# Patient Record
Sex: Female | Born: 1995 | Race: Black or African American | Hispanic: No | Marital: Single
Health system: Southern US, Community
[De-identification: ages and names within clinical notes are randomized; demographics above are authoritative.]

---
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
## 2017-01-06 ENCOUNTER — Other Ambulatory Visit: Payer: Self-pay | Admitting: Family Medicine

## 2017-01-06 ENCOUNTER — Other Ambulatory Visit (HOSPITAL_COMMUNITY)
Admission: RE | Admit: 2017-01-06 | Discharge: 2017-01-06 | Disposition: A | Discharge status: Home / Self Care | Payer: BLUE CROSS/BLUE SHIELD | Source: Ambulatory Visit | Attending: Family Medicine | Admitting: Family Medicine

## 2017-01-06 DIAGNOSIS — N63 Unspecified lump in unspecified breast: Secondary | ICD-10-CM

## 2017-01-06 DIAGNOSIS — Z113 Encounter for screening for infections with a predominantly sexual mode of transmission: Secondary | ICD-10-CM | POA: Insufficient documentation

## 2017-01-10 ENCOUNTER — Other Ambulatory Visit: Payer: BLUE CROSS/BLUE SHIELD

## 2017-01-10 LAB — URINE CYTOLOGY ANCILLARY ONLY
Bacterial vaginitis: POSITIVE — AB
CANDIDA VAGINITIS: NEGATIVE
Chlamydia: NEGATIVE
Neisseria Gonorrhea: NEGATIVE
Trichomonas: NEGATIVE

## 2017-01-20 ENCOUNTER — Other Ambulatory Visit: Payer: BLUE CROSS/BLUE SHIELD

## 2017-10-24 ENCOUNTER — Ambulatory Visit: Payer: Self-pay | Admitting: Allergy & Immunology

## 2018-10-31 ENCOUNTER — Other Ambulatory Visit: Payer: Self-pay | Admitting: Family Medicine

## 2018-10-31 ENCOUNTER — Other Ambulatory Visit (HOSPITAL_COMMUNITY)
Admission: RE | Admit: 2018-10-31 | Discharge: 2018-10-31 | Disposition: A | Discharge status: Home / Self Care | Payer: BC Managed Care – PPO | Source: Ambulatory Visit | Attending: Family Medicine | Admitting: Family Medicine

## 2018-10-31 DIAGNOSIS — Z01419 Encounter for gynecological examination (general) (routine) without abnormal findings: Secondary | ICD-10-CM | POA: Insufficient documentation

## 2018-11-02 LAB — CYTOLOGY - PAP: Diagnosis: NEGATIVE

## 2018-11-03 LAB — URINE CYTOLOGY ANCILLARY ONLY
Bacterial vaginitis: POSITIVE — AB
Candida vaginitis: NEGATIVE
Chlamydia: NEGATIVE
Neisseria Gonorrhea: NEGATIVE
Trichomonas: NEGATIVE

## 2020-03-18 ENCOUNTER — Emergency Department: Payer: BC Managed Care – PPO

## 2020-03-18 ENCOUNTER — Other Ambulatory Visit: Payer: Self-pay

## 2020-03-18 ENCOUNTER — Emergency Department
Admission: EM | Admit: 2020-03-18 | Discharge: 2020-03-18 | Disposition: A | Discharge status: Home / Self Care | Payer: BC Managed Care – PPO | Attending: Student in an Organized Health Care Education/Training Program | Admitting: Student in an Organized Health Care Education/Training Program

## 2020-03-18 DIAGNOSIS — M545 Low back pain, unspecified: Secondary | ICD-10-CM | POA: Diagnosis present

## 2020-03-18 DIAGNOSIS — M546 Pain in thoracic spine: Secondary | ICD-10-CM | POA: Insufficient documentation

## 2020-03-18 LAB — POC URINE PREG, ED: Preg Test, Ur: NEGATIVE

## 2020-03-18 MED ORDER — MELOXICAM 7.5 MG PO TABS
15.0000 mg | ORAL_TABLET | Freq: Once | ORAL | Status: AC
  Administered 2020-03-18: 15 mg via ORAL
  Filled 2020-03-18: qty 2

## 2020-03-18 MED ORDER — MELOXICAM 15 MG PO TABS
15.0000 mg | ORAL_TABLET | Freq: Every day | ORAL | 0 refills | Status: AC
Start: 2020-03-18 — End: 2020-04-17

## 2020-03-18 MED ORDER — METHOCARBAMOL 750 MG PO TABS: 750.0000 mg | ORAL_TABLET | Freq: Four times a day (QID) | ORAL | 0 refills | Status: AC | PRN

## 2020-03-18 MED ORDER — TRAMADOL HCL 50 MG PO TABS: 50.0000 mg | ORAL_TABLET | Freq: Four times a day (QID) | ORAL | 0 refills | Status: AC | PRN
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
## 2021-04-10 ENCOUNTER — Other Ambulatory Visit: Payer: Self-pay | Admitting: Family Medicine

## 2021-04-10 ENCOUNTER — Other Ambulatory Visit (HOSPITAL_COMMUNITY)
Admission: RE | Admit: 2021-04-10 | Discharge: 2021-04-10 | Disposition: A | Discharge status: Home / Self Care | Payer: BC Managed Care – PPO | Source: Ambulatory Visit | Attending: Family Medicine | Admitting: Family Medicine

## 2021-04-10 DIAGNOSIS — Z113 Encounter for screening for infections with a predominantly sexual mode of transmission: Secondary | ICD-10-CM | POA: Diagnosis present

## 2021-04-10 DIAGNOSIS — N898 Other specified noninflammatory disorders of vagina: Secondary | ICD-10-CM | POA: Insufficient documentation

## 2021-04-13 LAB — MOLECULAR ANCILLARY ONLY
Bacterial Vaginitis (gardnerella): NEGATIVE
Bacterial Vaginitis (gardnerella): POSITIVE — AB
Candida Glabrata: NEGATIVE
Candida Glabrata: NEGATIVE
Candida Vaginitis: NEGATIVE
Candida Vaginitis: POSITIVE — AB
Chlamydia: NEGATIVE
Chlamydia: NEGATIVE
Comment: NEGATIVE
Comment: NEGATIVE
Comment: NEGATIVE
Comment: NEGATIVE
Comment: NEGATIVE
Comment: NEGATIVE
Comment: NEGATIVE
Comment: NEGATIVE
Comment: NEGATIVE
Comment: NEGATIVE
Comment: NORMAL
Comment: NORMAL
Neisseria Gonorrhea: NEGATIVE
Neisseria Gonorrhea: NEGATIVE
Trichomonas: NEGATIVE
Trichomonas: NEGATIVE
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
## 2023-05-09 ENCOUNTER — Emergency Department (HOSPITAL_BASED_OUTPATIENT_CLINIC_OR_DEPARTMENT_OTHER)
Admission: EM | Admit: 2023-05-09 | Discharge: 2023-05-09 | Disposition: A | Discharge status: Home / Self Care | Payer: BC Managed Care – PPO | Attending: Emergency Medicine | Admitting: Emergency Medicine

## 2023-05-09 ENCOUNTER — Other Ambulatory Visit: Payer: Self-pay

## 2023-05-09 ENCOUNTER — Emergency Department (HOSPITAL_BASED_OUTPATIENT_CLINIC_OR_DEPARTMENT_OTHER): Payer: BC Managed Care – PPO

## 2023-05-09 DIAGNOSIS — R911 Solitary pulmonary nodule: Secondary | ICD-10-CM | POA: Diagnosis not present

## 2023-05-09 DIAGNOSIS — R079 Chest pain, unspecified: Secondary | ICD-10-CM | POA: Diagnosis present

## 2023-05-09 LAB — HCG, SERUM, QUALITATIVE: Preg, Serum: NEGATIVE

## 2023-05-09 LAB — BASIC METABOLIC PANEL
Anion gap: 7 (ref 5–15)
BUN: 10 mg/dL (ref 6–20)
CO2: 25 mmol/L (ref 22–32)
Calcium: 9.3 mg/dL (ref 8.9–10.3)
Chloride: 106 mmol/L (ref 98–111)
Creatinine, Ser: 0.9 mg/dL (ref 0.44–1.00)
GFR, Estimated: >60 mL/min (ref >60)
Glucose, Bld: 113 mg/dL — ABNORMAL HIGH (ref 70–99)
Potassium: 3.9 mmol/L (ref 3.5–5.1)
Sodium: 138 mmol/L (ref 135–145)

## 2023-05-09 MED ORDER — IOHEXOL 350 MG/ML SOLN
100.0000 mL | Freq: Once | INTRAVENOUS | Status: AC | PRN
  Administered 2023-05-09: 75 mL via INTRAVENOUS

## 2023-05-09 NOTE — ED Triage Notes (Signed)
Seen at [facility] for left sided CP- 1 week. Nexplanon palced 10/30. Worked up for CP and d-dimer tested. D-dimer found to be elevated- 3.8 mg/L FEU

## 2023-05-09 NOTE — Discharge Instructions (Addendum)
Please follow-up with your primary care provider regards recent ER visit.  Today your labs and imaging were reassuring however you do have a 3 mm nodule in your right lower lobe that does not need further follow-up.  If symptoms change or worsen please return to the ER.

## 2023-05-09 NOTE — ED Provider Notes (Signed)
[place] EMERGENCY DEPARTMENT AT [facility] Provider Note   CSN: [id number] Arrival date & time: 05/09/23  1637     History  Chief Complaint  Patient presents with   Chest Pain    Taylor Brandt is a 27 y.o. female no pertinent past medical history presented due to an elevated D-dimer.  Patient states that she has had chest discomfort since last Friday with some shortness of breath however this is mostly in the morning states that it is exacerbated with movement of her left shoulder.  Patient denies any cardiac or respiratory history or history of blood clots.  Patient is not on estrogen but notes that last 3 months she did go on a road trip to [place].  Patient denies hemoptysis or leg swelling or calf tenderness.  Patient went to urgent care and was referred here due to elevated D-dimer.    Home Medications Prior to Admission medications   Not on File      Allergies    Patient has no allergy information on record.    Review of Systems   Review of Systems  Cardiovascular:  Positive for chest pain.    Physical Exam Updated Vital Signs BP 121/67   Pulse 70   Temp 98.1 F (36.7 C) (Oral)   Resp 16   SpO2 100%  Physical Exam Constitutional:      General: She is not in acute distress. Cardiovascular:     Rate and Rhythm: Normal rate and regular rhythm.     Pulses: Normal pulses.     Heart sounds: Normal heart sounds.  Pulmonary:     Effort: Pulmonary effort is normal. No respiratory distress.     Breath sounds: Normal breath sounds.     Comments: Able to speak in full sentences Musculoskeletal:     Right lower leg: No tenderness. No edema.     Left lower leg: No tenderness. No edema.  Skin:    General: Skin is warm and dry.  Neurological:     Mental Status: She is alert.  Psychiatric:        Mood and Affect: Mood normal.     ED Results / Procedures / Treatments   Labs (all labs ordered are listed, but only abnormal results are displayed) Labs  Reviewed  BASIC METABOLIC PANEL - Abnormal; Notable for the following components:      Result Value   Glucose, Bld 113 (*)    All other components within normal limits  HCG, SERUM, QUALITATIVE    EKG EKG Interpretation Date/Time:  Monday May 09 2023 17:01:32 EST Ventricular Rate:  88 PR Interval:  162 QRS Duration:  78 QT Interval:  342 QTC Calculation: 413 R Axis:   69  Text Interpretation: Normal sinus rhythm ST elevation, consider early repolarization, pericarditis, or injury Abnormal ECG No previous ECGs available Confirmed by Benjiman Core [phone]) on 05/09/2023 5:43:43 PM  Radiology CT Angio Chest PE W and/or Wo Contrast Result Date: 05/09/2023 CLINICAL DATA:  Left-sided chest pain. EXAM: CT ANGIOGRAPHY CHEST WITH CONTRAST TECHNIQUE: Multidetector CT imaging of the chest was performed using the standard protocol during bolus administration of intravenous contrast. Multiplanar CT image reconstructions and MIPs were obtained to evaluate the vascular anatomy. RADIATION DOSE REDUCTION: This exam was performed according to the departmental dose-optimization program which includes automated exposure control, adjustment of the mA and/or kV according to patient size and/or use of iterative reconstruction technique. CONTRAST:  75mL OMNIPAQUE IOHEXOL 350 MG/ML SOLN COMPARISON:  None  Available. FINDINGS: Cardiovascular: Satisfactory opacification of the pulmonary arteries to the segmental level. No evidence of pulmonary embolism. Normal heart size. No pericardial effusion. Mediastinum/Nodes: No enlarged mediastinal, hilar, or axillary lymph nodes. Thyroid gland, trachea, and esophagus demonstrate no significant findings. Lungs/Pleura: A 3 mm noncalcified lung nodule is seen within the posterolateral aspect of the right lower lobe (axial CT image 78, CT series 6). There is no evidence of an acute infiltrate, pleural effusion or pneumothorax. Upper Abdomen: No acute abnormality.  Musculoskeletal: No chest wall abnormality. No acute or significant osseous findings. Review of the MIP images confirms the above findings. IMPRESSION: 1. No evidence of pulmonary embolism or other acute intrathoracic process. 2. 3 mm noncalcified right lower lobe lung nodule. No follow-up needed if patient is low-risk.This recommendation follows the consensus statement: Guidelines for Management of Incidental Pulmonary Nodules Detected on CT Images: From the Fleischner Society 2017; Radiology 2017; 284:228-243. Electronically Signed   By: Aram Candela M.D.   On: 05/09/2023 19:31    Procedures Procedures    Medications Ordered in ED Medications  iohexol (OMNIPAQUE) 350 MG/ML injection 100 mL (75 mLs Intravenous Contrast Given 05/09/23 1739)    ED Course/ Medical Decision Making/ A&P                                 Medical Decision Making Amount and/or Complexity of Data Reviewed Labs: ordered. Radiology: ordered.  Risk Prescription drug management.      Taylor Brandt 27 y.o. presented today for chest pain. Working DDx that I considered at this time includes, but not limited to, ACS, GERD, pulmonary embolism, community-acquired pneumonia, aortic dissection, pneumothorax, underlying bony abnormality, anemia, thyrotoxicosis, esophageal rupture, CHF exacerbation, valvular disorder, myocarditis, pericarditis, endocarditis, pericardial effusion/cardiac tamponade, pulmonary edema, gastritis/PUD, esophagitis.  R/o Dx: ACS, GERD, pulmonary embolism, community-acquired pneumonia, aortic dissection, pneumothorax, underlying bony abnormality, anemia, thyrotoxicosis, esophageal rupture, CHF exacerbation, valvular disorder, myocarditis, pericarditis, endocarditis, pericardial effusion/cardiac tamponade, pulmonary edema, gastritis/PUD, esophagitis: These are considered less likely due to history of present illness and physical exam findings.  PE: CTA negative  Aortic Dissection: less likely  based on the location, quality, onset, and severity of symptoms in this case. Patient also has a lack of underlying history of AD or TAA.   Review of prior external notes: 03/23/2023 unknown  Unique Tests and My Interpretation:  EKG: Rate, rhythm, axis, intervals all examined and without medically relevant abnormality. ST segments without concerns for elevations BMP: Unremarkable hCG serum qualitative: Negative CTA chest PE: 3 mm right lower lobe pulmonary nodule that does not need follow-up  Social Determinants of Health: none  Discussion with Independent Historian:  Family member  Discussion of Management of Tests: None  Risk: Low: based on diagnostic testing/clinical impression and treatment plan  Risk Stratification Score: none   Plan: On exam patient was in no acute distress stable vitals. Patient's physical was remarkable for being able to reproduce patient's chest discomfort with palpation to left chest wall along with movement of her left arm indicative of possible MSK given that this occurs in the morning after she has been sleeping on her left side.  Patient is currently not having any symptoms however is here due to elevated D-dimer and so CTA was ordered out of triage.  Patient's labs here are reassuring and I have added pictures of patient's results from the urgent care.  Patient states that since she is not having chest  pain and does not have any heart history she does not want troponins drawn until the CTA is back and only if she has a PE and so we will hold off on troponins at this time which is reasonable as patient is very low risk for ACS and due to patient's exam showing possible MSK pathology.  Will follow-up on CTA and dispo accordingly.  Patient not requiring any medications at this time.  CT came back negative for PE but does show pulmonary nodules in which patient was made aware of.  Patient does not need follow-up but she is very low risk.  Will give patient primary  care follow-up and encouraged her to monitor symptoms take Tylenol every 6 hours needed for pain as this could be musculoskeletal.  Patient was given return precautions. Patient stable for discharge at this time.  Patient verbalized understanding of plan.  This chart was dictated using voice recognition software.  Despite best efforts to proofread,  errors can occur which can change the documentation meaning.         Final Clinical Impression(s) / ED Diagnoses Final diagnoses:  Lung nodule    Rx / DC Orders ED Discharge Orders     None         Remi Deter 05/09/23 Donalynn Furlong, MD 05/09/23 2328

## 2023-05-24 ENCOUNTER — Inpatient Hospital Stay (HOSPITAL_BASED_OUTPATIENT_CLINIC_OR_DEPARTMENT_OTHER): Payer: BC Managed Care – PPO | Admitting: Family Medicine

## 2023-05-26 ENCOUNTER — Ambulatory Visit (HOSPITAL_BASED_OUTPATIENT_CLINIC_OR_DEPARTMENT_OTHER): Payer: BC Managed Care – PPO | Admitting: Family Medicine

## 2023-05-30 ENCOUNTER — Ambulatory Visit (HOSPITAL_BASED_OUTPATIENT_CLINIC_OR_DEPARTMENT_OTHER): Payer: BC Managed Care – PPO | Admitting: Family Medicine
